# Patient Record
Sex: Female | Born: 1960
Health system: Southern US, Community
[De-identification: ages and names within clinical notes are randomized; demographics above are authoritative.]

## PROBLEM LIST (undated history)

## (undated) DIAGNOSIS — T8859XA Other complications of anesthesia, initial encounter: Secondary | ICD-10-CM

## (undated) HISTORY — DX: Other complications of anesthesia, initial encounter: T88.59XA

## (undated) HISTORY — PX: LAPAROSCOPIC OVARIAN CYSTECTOMY: SUR786

## (undated) HISTORY — PX: DILATION AND CURETTAGE OF UTERUS: SHX78

---

## 2002-04-22 ENCOUNTER — Other Ambulatory Visit: Admission: RE | Admit: 2002-04-22 | Discharge: 2002-04-22 | Payer: Self-pay | Admitting: Obstetrics and Gynecology

## 2003-01-29 ENCOUNTER — Ambulatory Visit (HOSPITAL_COMMUNITY): Admission: RE | Admit: 2003-01-29 | Discharge: 2003-01-29 | Payer: Self-pay | Admitting: Obstetrics and Gynecology

## 2003-04-29 ENCOUNTER — Other Ambulatory Visit: Admission: RE | Admit: 2003-04-29 | Discharge: 2003-04-29 | Payer: Self-pay | Admitting: Obstetrics and Gynecology

## 2004-08-05 ENCOUNTER — Other Ambulatory Visit: Admission: RE | Admit: 2004-08-05 | Discharge: 2004-08-05 | Payer: Self-pay | Admitting: Obstetrics and Gynecology

## 2005-09-02 ENCOUNTER — Other Ambulatory Visit: Admission: RE | Admit: 2005-09-02 | Discharge: 2005-09-02 | Payer: Self-pay | Admitting: Obstetrics and Gynecology

## 2015-02-19 ENCOUNTER — Other Ambulatory Visit: Payer: Self-pay | Admitting: Obstetrics and Gynecology

## 2015-02-20 LAB — CYTOLOGY - PAP

## 2016-02-17 ENCOUNTER — Encounter: Payer: Self-pay | Admitting: Internal Medicine

## 2016-04-19 ENCOUNTER — Encounter: Payer: Self-pay | Admitting: Internal Medicine

## 2016-05-17 ENCOUNTER — Encounter (INDEPENDENT_AMBULATORY_CARE_PROVIDER_SITE_OTHER): Payer: Self-pay

## 2016-05-17 ENCOUNTER — Ambulatory Visit (AMBULATORY_SURGERY_CENTER): Payer: Self-pay

## 2016-05-17 VITALS — Ht 65.5 in | Wt 133.0 lb

## 2016-05-17 DIAGNOSIS — Z800 Family history of malignant neoplasm of digestive organs: Secondary | ICD-10-CM

## 2016-05-17 NOTE — Progress Notes (Signed)
Per pt, no allergies to soy or egg products.Pt not taking any weight loss meds or using  O2 at home. 

## 2016-05-18 ENCOUNTER — Encounter: Payer: Self-pay | Admitting: Internal Medicine

## 2016-05-27 ENCOUNTER — Encounter: Payer: Self-pay | Admitting: Internal Medicine

## 2016-05-27 ENCOUNTER — Ambulatory Visit (AMBULATORY_SURGERY_CENTER): Payer: 59 | Admitting: Internal Medicine

## 2016-05-27 VITALS — BP 107/66 | HR 72 | Temp 98.7°F | Resp 17 | Ht 65.5 in | Wt 133.0 lb

## 2016-05-27 DIAGNOSIS — Z1211 Encounter for screening for malignant neoplasm of colon: Secondary | ICD-10-CM | POA: Diagnosis present

## 2016-05-27 MED ORDER — SODIUM CHLORIDE 0.9 % IV SOLN: 500.0000 mL | INTRAVENOUS | Status: AC

## 2016-05-27 NOTE — Op Note (Signed)
[facility] Patient Name: Shelby Moody Procedure Date: 05/27/2016 8:08 AM MRN: [id number] Endoscopist: Gatha Mayer , MD Age: 55 Referring MD:  Date of Birth: 1961-04-25 Gender: Female Account #: [id number] Procedure:                Colonoscopy Indications:              Screening for colorectal malignant neoplasm Medicines:                Propofol per Anesthesia, Monitored Anesthesia Care Procedure:                Pre-Anesthesia Assessment:                           - Prior to the procedure, a History and Physical                            was performed, and patient medications and                            allergies were reviewed. The patient's tolerance of                            previous anesthesia was also reviewed. The risks                            and benefits of the procedure and the sedation                            options and risks were discussed with the patient.                            All questions were answered, and informed consent                            was obtained. Prior Anticoagulants: The patient has                            taken no previous anticoagulant or antiplatelet                            agents. ASA Grade Assessment: I - A normal, healthy                            patient. After reviewing the risks and benefits,                            the patient was deemed in satisfactory condition to                            undergo the procedure.                           After obtaining informed consent, the colonoscope  was passed under direct vision. Throughout the                            procedure, the patient's blood pressure, pulse, and                            oxygen saturations were monitored continuously. The                            Model CF-HQ190L [id number]) scope was introduced                            through the anus and advanced to the the cecum,   identified by appendiceal orifice and ileocecal                            valve. The ileocecal valve, appendiceal orifice,                            and rectum were photographed. The quality of the                            bowel preparation was excellent. The bowel                            preparation used was Miralax. Scope In: 8:14:15 AM Scope Out: 8:27:30 AM Scope Withdrawal Time: 0 hours 10 minutes 5 seconds  Total Procedure Duration: 0 hours 13 minutes 15 seconds  Findings:                 The perianal and digital rectal examinations were                            normal.                           A few diverticula were found in the sigmoid colon.                           The exam was otherwise without abnormality on                            direct and retroflexion views. Complications:            No immediate complications. Estimated blood loss:                            None. Estimated Blood Loss:     Estimated blood loss: none. Recommendation:           - Repeat colonoscopy in 10 years for screening                            purposes.                           - Resume previous diet.                           -  Continue present medications.                           - Patient has a contact number available for                            emergencies. The signs and symptoms of potential                            delayed complications were discussed with the                            patient. Return to normal activities tomorrow.                            Written discharge instructions were provided to the                            patient. Gatha Mayer, MD 05/27/2016 8:36:08 AM This report has been signed electronically.

## 2016-05-27 NOTE — Progress Notes (Signed)
A/ox3 pleased with MAC, report to Suzanne RN 

## 2016-05-27 NOTE — Patient Instructions (Addendum)
No polyps or cancer seen.  You do have diverticulosis - thickened muscle rings and pouches in the colon wall. Please read the handout about this condition.  I appreciate the opportunity to care for you. Gatha Mayer, MD, FACG  YOU HAD AN ENDOSCOPIC PROCEDURE TODAY AT [facility]:   Refer to the procedure report that was given to you for any specific questions about what was found during the examination.  If the procedure report does not answer your questions, please call your gastroenterologist to clarify.  If you requested that your care partner not be given the details of your procedure findings, then the procedure report has been included in a sealed envelope for you to review at your convenience later.  YOU SHOULD EXPECT: Some feelings of bloating in the abdomen. Passage of more gas than usual.  Walking can help get rid of the air that was put into your GI tract during the procedure and reduce the bloating. If you had a lower endoscopy (such as a colonoscopy or flexible sigmoidoscopy) you may notice spotting of blood in your stool or on the toilet paper. If you underwent a bowel prep for your procedure, you may not have a normal bowel movement for a few days.  Please Note:  You might notice some irritation and congestion in your nose or some drainage.  This is from the oxygen used during your procedure.  There is no need for concern and it should clear up in a day or so.  SYMPTOMS TO REPORT IMMEDIATELY:   Following lower endoscopy (colonoscopy or flexible sigmoidoscopy):  Excessive amounts of blood in the stool  Significant tenderness or worsening of abdominal pains  Swelling of the abdomen that is new, acute  Fever of 100F or higher   For urgent or emergent issues, a gastroenterologist can be reached at any hour by calling [phone].   DIET:  We do recommend a small meal at first, but then you may proceed to your regular diet.  Drink plenty of  fluids but you should avoid alcoholic beverages for 24 hours. Try to increase the fiber in your diet, and drink plenty of water.  ACTIVITY:  You should plan to take it easy for the rest of today and you should NOT DRIVE or use heavy machinery until tomorrow (because of the sedation medicines used during the test).    FOLLOW UP: Our staff will call the number listed on your records the next business day following your procedure to check on you and address any questions or concerns that you may have regarding the information given to you following your procedure. If we do not reach you, we will leave a message.  However, if you are feeling well and you are not experiencing any problems, there is no need to return our call.  We will assume that you have returned to your regular daily activities without incident.  If any biopsies were taken you will be contacted by phone or by letter within the next 1-3 weeks.  Please call us at [phone] if you have not heard about the biopsies in 3 weeks.    SIGNATURES/CONFIDENTIALITY: You and/or your care partner have signed paperwork which will be entered into your electronic medical record.  These signatures attest to the fact that that the information above on your After Visit Summary has been reviewed and is understood.  Full responsibility of the confidentiality of this discharge information lies with you and/or your  care-partner.  Read all of the  Handouts given to you by your recovery room nurse.   Thank-you for choosing [facility] for your healthcare needs today.

## 2016-05-30 ENCOUNTER — Telehealth: Payer: Self-pay

## 2016-05-30 NOTE — Telephone Encounter (Signed)
  Follow up Call-  Call back number 05/27/2016  Post procedure Call Back phone  # [phone]  Permission to leave phone message Yes  Some recent data might be hidden    Patient was called for follow up after her procedure on 05/27/2016. No answer at the number given for follow up phone call. This was a second attempt to reach the patient. A message was left on the answering machine.

## 2016-05-30 NOTE — Telephone Encounter (Signed)
Left a message at [phone] for the pt to call us back if any questions or concerns. maw

## 2016-05-30 NOTE — Telephone Encounter (Signed)
  Follow up Call-  Call back number 05/27/2016  Post procedure Call Back phone  # [phone]  Permission to leave phone message Yes  Some recent data might be hidden     Patient questions:  Do you have a fever, pain , or abdominal swelling? No. Pain Score  0 *  Have you tolerated food without any problems? Yes.    Have you been able to return to your normal activities? Yes.    Do you have any questions about your discharge instructions: Diet   No. Medications  No. Follow up visit  No.  Do you have questions or concerns about your Care? No.  Actions: * If pain score is 4 or above: No action needed, pain <4.

## 2017-05-31 DIAGNOSIS — Z6821 Body mass index (BMI) 21.0-21.9, adult: Secondary | ICD-10-CM | POA: Diagnosis not present

## 2017-05-31 DIAGNOSIS — Z01419 Encounter for gynecological examination (general) (routine) without abnormal findings: Secondary | ICD-10-CM | POA: Diagnosis not present

## 2017-05-31 DIAGNOSIS — N760 Acute vaginitis: Secondary | ICD-10-CM | POA: Diagnosis not present

## 2017-05-31 DIAGNOSIS — R9389 Abnormal findings on diagnostic imaging of other specified body structures: Secondary | ICD-10-CM | POA: Diagnosis not present

## 2017-05-31 DIAGNOSIS — Z1231 Encounter for screening mammogram for malignant neoplasm of breast: Secondary | ICD-10-CM | POA: Diagnosis not present

## 2017-07-03 DIAGNOSIS — L821 Other seborrheic keratosis: Secondary | ICD-10-CM | POA: Diagnosis not present

## 2017-07-03 DIAGNOSIS — D225 Melanocytic nevi of trunk: Secondary | ICD-10-CM | POA: Diagnosis not present

## 2017-07-03 DIAGNOSIS — L814 Other melanin hyperpigmentation: Secondary | ICD-10-CM | POA: Diagnosis not present

## 2017-07-03 DIAGNOSIS — D1801 Hemangioma of skin and subcutaneous tissue: Secondary | ICD-10-CM | POA: Diagnosis not present

## 2017-09-20 ENCOUNTER — Emergency Department (HOSPITAL_COMMUNITY)
Admission: EM | Admit: 2017-09-20 | Discharge: 2017-09-20 | Disposition: A | Discharge status: Home / Self Care | Payer: No Typology Code available for payment source | Attending: Emergency Medicine | Admitting: Emergency Medicine

## 2017-09-20 ENCOUNTER — Emergency Department (HOSPITAL_COMMUNITY): Payer: No Typology Code available for payment source

## 2017-09-20 ENCOUNTER — Encounter (HOSPITAL_COMMUNITY): Payer: Self-pay | Admitting: Emergency Medicine

## 2017-09-20 DIAGNOSIS — R42 Dizziness and giddiness: Secondary | ICD-10-CM | POA: Insufficient documentation

## 2017-09-20 DIAGNOSIS — R202 Paresthesia of skin: Secondary | ICD-10-CM | POA: Insufficient documentation

## 2017-09-20 DIAGNOSIS — R0789 Other chest pain: Secondary | ICD-10-CM | POA: Diagnosis not present

## 2017-09-20 DIAGNOSIS — Z79899 Other long term (current) drug therapy: Secondary | ICD-10-CM | POA: Insufficient documentation

## 2017-09-20 DIAGNOSIS — R079 Chest pain, unspecified: Secondary | ICD-10-CM

## 2017-09-20 LAB — CBC
HEMATOCRIT: 43.2 % (ref 36.0–46.0)
HEMOGLOBIN: 14.5 g/dL (ref 12.0–15.0)
MCH: 30 pg (ref 26.0–34.0)
MCHC: 33.6 g/dL (ref 30.0–36.0)
MCV: 89.3 fL (ref 78.0–100.0)
Platelets: 212 10*3/uL (ref 150–400)
RBC: 4.84 MIL/uL (ref 3.87–5.11)
RDW: 13.9 % (ref 11.5–15.5)
WBC: 8.5 10*3/uL (ref 4.0–10.5)

## 2017-09-20 LAB — BASIC METABOLIC PANEL
ANION GAP: 10 (ref 5–15)
BUN: 18 mg/dL (ref 6–20)
CALCIUM: 9.5 mg/dL (ref 8.9–10.3)
CO2: 26 mmol/L (ref 22–32)
Chloride: 101 mmol/L (ref 101–111)
Creatinine, Ser: 0.72 mg/dL (ref 0.44–1.00)
Glucose, Bld: 78 mg/dL (ref 65–99)
POTASSIUM: 4.1 mmol/L (ref 3.5–5.1)
SODIUM: 137 mmol/L (ref 135–145)

## 2017-09-20 LAB — I-STAT TROPONIN, ED
TROPONIN I, POC: 0 ng/mL (ref 0.00–0.08)
TROPONIN I, POC: 0 ng/mL (ref 0.00–0.08)

## 2017-09-20 LAB — I-STAT BETA HCG BLOOD, ED (MC, WL, AP ONLY)

## 2017-09-20 MED ORDER — PANTOPRAZOLE SODIUM 20 MG PO TBEC: 40.0000 mg | DELAYED_RELEASE_TABLET | Freq: Every day | ORAL | 0 refills | Status: AC

## 2017-09-20 MED FILL — PANTOPRAZOLE SOD DR 20 MG T: 20 | 7 days supply | Qty: 14 | Fill #0

## 2017-09-20 NOTE — ED Provider Notes (Signed)
[facility] DEPT Provider Note   CSN: [id number] Arrival date & time: 09/20/17  1004     History   Chief Complaint Chief Complaint  Patient presents with  . Chest Pain  . Dizziness    HPI Shelby Moody is a 57 y.o. female.  HPI  Over the last few days has had increasing symptoms of chest pain, feels like indigestion, lump in the throat feeling.  Pain today felt some pressure, also felt it yesterday. Stayed in anterior chest, no radiation. Has had lightheadedness with episodes.  Tingling in both fingers today while working in [place] with other symptoms, blood perssure was 140/104. No shortness of breath, nausea, diaphoresis. Pain 2-3/10, 6AM had some burning.   Had stressful event in November, had some regurg develop  No leg pain or swelling, no cough or fever No hx of DVT, PE, no medical hx. Has PCP. No fam hx of early heart disease, no smoking or other drugs.    Past Medical History:  Diagnosis Date  . Slow to wake up after anesthesia    after cystectomy     There are no active problems to display for this patient.   Past Surgical History:  Procedure Laterality Date  . DILATION AND CURETTAGE OF UTERUS    . LAPAROSCOPIC OVARIAN CYSTECTOMY      OB History    No data available       Home Medications    Prior to Admission medications   Medication Sig Start Date End Date Taking? Authorizing Provider  Calcium Carbonate-Vitamin D (CALCIUM 600+D) 600-400 MG-UNIT tablet Take 1 tablet by mouth daily.   Yes [provider]  Cholecalciferol (VITAMIN D3) 2000 units TABS Take by mouth 2 (two) times daily.   Yes [provider]  ibuprofen (ADVIL,MOTRIN) 200 MG tablet Take 400 mg by mouth every 6 (six) hours as needed for mild pain or moderate pain.   Yes [provider]  Multiple Vitamin (MULTIVITAMIN) tablet Take 1 tablet by mouth daily.   Yes [provider]  Omega-3 Fatty Acids (FISH OIL) 1000 MG CAPS Take  by mouth daily.   Yes [provider]  Ginkgo Biloba 120 MG CAPS Take by mouth daily.    [provider]  pantoprazole (PROTONIX) 20 MG tablet Take 2 tablets (40 mg total) by mouth daily for 7 days. 09/20/17 09/27/17  Gareth Morgan, MD    Family History Family History  Problem Relation Age of Onset  . Hypertension Mother   . Thyroid cancer Brother   . Colon cancer Paternal Aunt   . Thyroid cancer Brother     Social History Social History   Tobacco Use  . Smoking status: Never Smoker  . Smokeless tobacco: Never Used  Substance Use Topics  . Alcohol use: Yes    Alcohol/week: 1.8 oz    Types: 3 Glasses of wine per week  . Drug use: No     Allergies   Patient has no known allergies.   Review of Systems Review of Systems  Constitutional: Negative for fever.  HENT: Negative for sore throat.   Eyes: Negative for visual disturbance.  Respiratory: Negative for cough and shortness of breath.   Cardiovascular: Positive for chest pain. Negative for leg swelling.  Gastrointestinal: Negative for abdominal pain, nausea and vomiting.  Genitourinary: Negative for difficulty urinating.  Musculoskeletal: Negative for back pain and neck pain.  Skin: Negative for rash.  Neurological: Positive for light-headedness. Negative for syncope and headaches.  Physical Exam Updated Vital Signs BP 116/73   Pulse 63   Temp 97.7 F (36.5 C) (Oral)   Resp 18   Ht 5' 5.5" (1.664 m)   Wt 56.7 kg (125 lb)   LMP 07/01/2017   SpO2 99%   BMI 20.48 kg/m   Physical Exam  Constitutional: She is oriented to person, place, and time. She appears well-developed and well-nourished. No distress.  HENT:  Head: Normocephalic and atraumatic.  Eyes: Conjunctivae and EOM are normal.  Neck: Normal range of motion.  Cardiovascular: Normal rate, regular rhythm, normal heart sounds and intact distal pulses. Exam reveals no gallop and no friction rub.  No murmur heard. Pulmonary/Chest:  Effort normal and breath sounds normal. No respiratory distress. She has no wheezes. She has no rales.  Abdominal: Soft. She exhibits no distension. There is no tenderness. There is no guarding.  Musculoskeletal: She exhibits no edema or tenderness.  Neurological: She is alert and oriented to person, place, and time.  Skin: Skin is warm and dry. No rash noted. She is not diaphoretic. No erythema.  Nursing note and vitals reviewed.    ED Treatments / Results  Labs (all labs ordered are listed, but only abnormal results are displayed) Labs Reviewed  BASIC METABOLIC PANEL  CBC  I-STAT TROPONIN, ED  I-STAT BETA HCG BLOOD, ED ([facility], [facility], [facility] ONLY)  I-STAT TROPONIN, ED    EKG  EKG Interpretation  Date/Time:  Wednesday September 20 2017 10:18:45 EST Ventricular Rate:  70 PR Interval:    QRS Duration: 98 QT Interval:  387 QTC Calculation: 418 R Axis:   93 Text Interpretation:  Sinus rhythm Borderline right axis deviation No previous ECGs available Confirmed by Gareth Morgan [phone]) on 09/20/2017 11:25:24 AM       Radiology Dg Chest 2 View  Result Date: 09/20/2017 CLINICAL DATA:  Mid chest pain, pressure, dizziness EXAM: CHEST  2 VIEW COMPARISON:  None. FINDINGS: Heart and mediastinal contours are within normal limits. No focal opacities or effusions. No acute bony abnormality. IMPRESSION: No active cardiopulmonary disease. Electronically Signed   By: Rolm Baptise M.D.   On: 09/20/2017 10:50    Procedures Procedures (including critical care time)  Medications Ordered in ED Medications - No data to display   Initial Impression / Assessment and Plan / ED Course  I have reviewed the triage vital signs and the nursing notes.  Pertinent labs & imaging results that were available during my care of the patient were reviewed by me and considered in my medical decision making (see chart for details).     57 year old female with no significant medical history presents with concern for  chest pain.  EKG shows no significant findings.  Chest x-ray shows no sign of pneumothorax or pneumonia.  Have low suspicion for pulmonary embolus given no shortness of breath, no estrogen use, no asymmetric leg pain or swelling, no recent immobilization, no tachypnea, hypoxia or tachycardia.  CBC and BMP within normal limits.  Patient is a low risk heart score, and has 2 negative troponins.  Recommend close PCP follow up. Given rx for protonix given sensation of indigestion.  Patient discharged in stable condition with understanding of reasons to return.   Final Clinical Impressions(s) / ED Diagnoses   Final diagnoses:  Chest pain, unspecified type    ED Discharge Orders        Ordered    pantoprazole (PROTONIX) 20 MG tablet  Daily     09/20/17 1448  Gareth Morgan, MD 09/20/17 1501

## 2017-09-20 NOTE — ED Notes (Signed)
Blood draw x1 unsuccessful. 

## 2017-09-20 NOTE — ED Triage Notes (Signed)
Patient c/o intermittent chest pains on left side and central chest pains. Reports that is burning and was relieved last night when she took Mylanta and drank buttermilk. Patient did have some neck pain but unsure if because her anxiety.

## 2017-09-20 NOTE — ED Notes (Signed)
Attempted blood draw; unsuccessful. Patient has been stuck 4 times today so did not want to try again. RN tech who was successful in triage to try.

## 2017-09-26 MED FILL — PANTOPRAZOLE SOD DR 40 MG T: 40 | 90 days supply | Qty: 90 | Fill #0

## 2018-02-28 MED FILL — PANTOPRAZOLE SOD DR 40 MG T: 40 | 90 days supply | Qty: 90 | Fill #1

## 2018-06-07 MED FILL — PANTOPRAZOLE SOD DR 40 MG T: 40 | 90 days supply | Qty: 90 | Fill #0

## 2018-07-10 ENCOUNTER — Ambulatory Visit (INDEPENDENT_AMBULATORY_CARE_PROVIDER_SITE_OTHER): Payer: Self-pay | Admitting: Nurse Practitioner

## 2018-07-10 VITALS — BP 115/80 | HR 78 | Temp 98.2°F | Resp 16 | Wt 127.4 lb

## 2018-07-10 DIAGNOSIS — J029 Acute pharyngitis, unspecified: Secondary | ICD-10-CM

## 2018-07-10 LAB — POCT RAPID STREP A (OFFICE): Rapid Strep A Screen: NEGATIVE

## 2018-07-10 MED ORDER — MAGIC MOUTHWASH W/LIDOCAINE: 5.0000 mL | Freq: Three times a day (TID) | ORAL | 0 refills | Status: AC | PRN

## 2018-07-10 NOTE — Progress Notes (Signed)
Subjective:     Shelby Moody is a 57 y.o. female who presents for evaluation of sore throat. Associated symptoms include sore throat. Onset of symptoms was 1 day ago, and have been unchanged since that time. Patient rates current pain 6/10 at present. She is drinking plenty of fluids. She has not had a recent close exposure to someone with proven streptococcal pharyngitis.  The patient states she has taken Advil for her symptoms, which has helped her pain.    The following portions of the patient's history were reviewed and updated as appropriate: allergies, current medications and past medical history.  Review of Systems Constitutional: positive for chills, negative for anorexia, fatigue, fevers and malaise Eyes: negative Ears, nose, mouth, throat, and face: positive for sore throat, negative for ear drainage, earaches, hoarseness and nasal congestion Respiratory: negative Cardiovascular: negative Gastrointestinal: negative Neurological: negative    Objective:    BP 115/80 (BP Location: Right Arm, Patient Position: Sitting, Cuff Size: Normal)   Pulse 78   Temp 98.2 F (36.8 C) (Oral)   Resp 16   Wt 127 lb 6.4 oz (57.8 kg)   SpO2 98%   BMI 20.88 kg/m  General appearance: alert, cooperative and no distress Head: Normocephalic, without obvious abnormality, atraumatic Eyes: conjunctivae/corneas clear. PERRL, EOM's intact. Fundi benign. Ears: normal TM's and external ear canals both ears Nose: Nares normal. Septum midline. Mucosa normal. No drainage or sinus tenderness. Throat: abnormal findings: mild oropharyngeal erythema and no exudates, tonsils +1 bilaterally, uvula midline Lungs: clear to auscultation bilaterally Heart: regular rate and rhythm, S1, S2 normal, no murmur, click, rub or gallop Abdomen: soft, non-tender; bowel sounds normal; no masses,  no organomegaly Pulses: 2+ and symmetric Skin: Skin color, texture, turgor normal. No rashes or lesions Lymph nodes: cervical and  submandibular nodes normal Neurologic: Grossly normal  Laboratory Strep test done. Results:negative.    Assessment:    Acute pharyngitis, likely  Viral pharyngitis.    Plan:   Exam findings, diagnosis etiology and medication use and indications reviewed with patient. Follow- Up and discharge instructions provided. No emergent/urgent issues found on exam. Patient education was provided. Patient verbalized understanding of information provided and agrees with plan of care (POC), all questions answered. The patient is advised to call or return to clinic if condition does not see an improvement in symptoms, or to seek the care of the closest emergency department if condition worsens with the above plan.   1. Sore throat  - POCT rapid strep A-negative  2. Viral pharyngitis  - magic mouthwash w/lidocaine SOLN; Take 5 mLs by mouth 3 (three) times daily as needed for up to 7 days for mouth pain. Gargle and swallow 76mL every 8 hours as needed for throat pain.  Dispense: 105 mL; Refill: 0 -Magic mouthwash as prescribed. -Ibuprofen 800mg  every 8 hours for the next 3 days.  May stop sooner if symptoms improve. -Increase fluids. -Warm salt water gargles 3-4 times daily until symptoms improve. -May use a teaspoon of honey as needed for throat discomfort. -Warm liquids to help soothe the throat during discomfort. -Follow-up in the next 5 to 7 days if symptoms do not improve.

## 2018-07-10 NOTE — Patient Instructions (Addendum)
Pharyngitis -Magic mouthwash as prescribed. -Ibuprofen 800mg  every 8 hours for the next 3 days.  May stop sooner if symptoms improve. -Increase fluids. -Warm salt water gargles 3-4 times daily until symptoms improve. -May use a teaspoon of honey as needed for throat discomfort. -Warm liquids to help soothe the throat during discomfort. -Follow-up in the next 5 to 7 days if symptoms do not improve.  Pharyngitis is redness, pain, and swelling (inflammation) of the throat (pharynx). It is a very common cause of sore throat. Pharyngitis can be caused by a bacteria, but it is usually caused by a virus. Most cases of pharyngitis get better on their own without treatment. What are the causes? This condition may be caused by:  Infection by viruses (viral). Viral pharyngitis spreads from person to person (is contagious) through coughing, sneezing, and sharing of personal items or utensils such as cups, forks, spoons, and toothbrushes.  Infection by bacteria (bacterial). Bacterial pharyngitis may be spread by touching the nose or face after coming in contact with the bacteria, or through more intimate contact, such as kissing.  Allergies. Allergies can cause buildup of mucus in the throat (post-nasal drip), leading to inflammation and irritation. Allergies can also cause blocked nasal passages, forcing breathing through the mouth, which dries and irritates the throat.  What increases the risk? You are more likely to develop this condition if:  You are 68-57 years old.  You are exposed to crowded environments such as daycare, school, or dormitory living.  You live in a cold climate.  You have a weakened disease-fighting (immune) system.  What are the signs or symptoms? Symptoms of this condition vary by the cause (viral, bacterial, or allergies) and can include:  Sore throat.  Fatigue.  Low-grade fever.  Headache.  Joint pain and muscle aches.  Skin rashes.  Swollen glands in the  throat (lymph nodes).  Plaque-like film on the throat or tonsils. This is often a symptom of bacterial pharyngitis.  Vomiting.  Stuffy nose (nasal congestion).  Cough.  Red, itchy eyes (conjunctivitis).  Loss of appetite.  How is this diagnosed? This condition is often diagnosed based on your medical history and a physical exam. Your health care provider will ask you questions about your illness and your symptoms. A swab of your throat may be done to check for bacteria (rapid strep test). Other lab tests may also be done, depending on the suspected cause, but these are rare. How is this treated? This condition usually gets better in 3-4 days without medicine. Bacterial pharyngitis may be treated with antibiotic medicines. Follow these instructions at home:  Take over-the-counter and prescription medicines only as told by your health care provider. ? If you were prescribed an antibiotic medicine, take it as told by your health care provider. Do not stop taking the antibiotic even if you start to feel better. ? Do not give children aspirin because of the association with Reye syndrome.  Drink enough water and fluids to keep your urine clear or pale yellow.  Get a lot of rest.  Gargle with a salt-water mixture 3-4 times a day or as needed. To make a salt-water mixture, completely dissolve -1 tsp of salt in 1 cup of warm water.  If your health care provider approves, you may use throat lozenges or sprays to soothe your throat. Contact a health care provider if:  You have large, tender lumps in your neck.  You have a rash.  You cough up green, yellow-brown, or bloody spit.  Get help right away if:  Your neck becomes stiff.  You drool or are unable to swallow liquids.  You cannot drink or take medicines without vomiting.  You have severe pain that does not go away, even after you take medicine.  You have trouble breathing, and it is not caused by a stuffy nose.  You have  new pain and swelling in your joints such as the knees, ankles, wrists, or elbows. Summary  Pharyngitis is redness, pain, and swelling (inflammation) of the throat (pharynx).  While pharyngitis can be caused by a bacteria, the most common causes are viral.  Most cases of pharyngitis get better on their own without treatment.  Bacterial pharyngitis is treated with antibiotic medicines. This information is not intended to replace advice given to you by your health care provider. Make sure you discuss any questions you have with your health care provider. Document Released: 08/15/2005 Document Revised: 09/20/2016 Document Reviewed: 09/20/2016 Elsevier Interactive Patient Education  [employer].

## 2018-10-25 MED FILL — MECLIZINE 25 MG TABLET: 25 | 30 days supply | Qty: 30 | Fill #0

## 2019-06-17 MED FILL — PANTOPRAZOLE SOD DR 40 MG T: 40 | 90 days supply | Qty: 30 | Fill #0

## 2019-09-24 IMAGING — CR DG CHEST 2V
2 series · 2 of 2 positions shown · non-contrast
Comparison: None.

CLINICAL DATA: Mid chest pain, pressure, dizziness

EXAM:
CHEST  2 VIEW

[w chest pa]
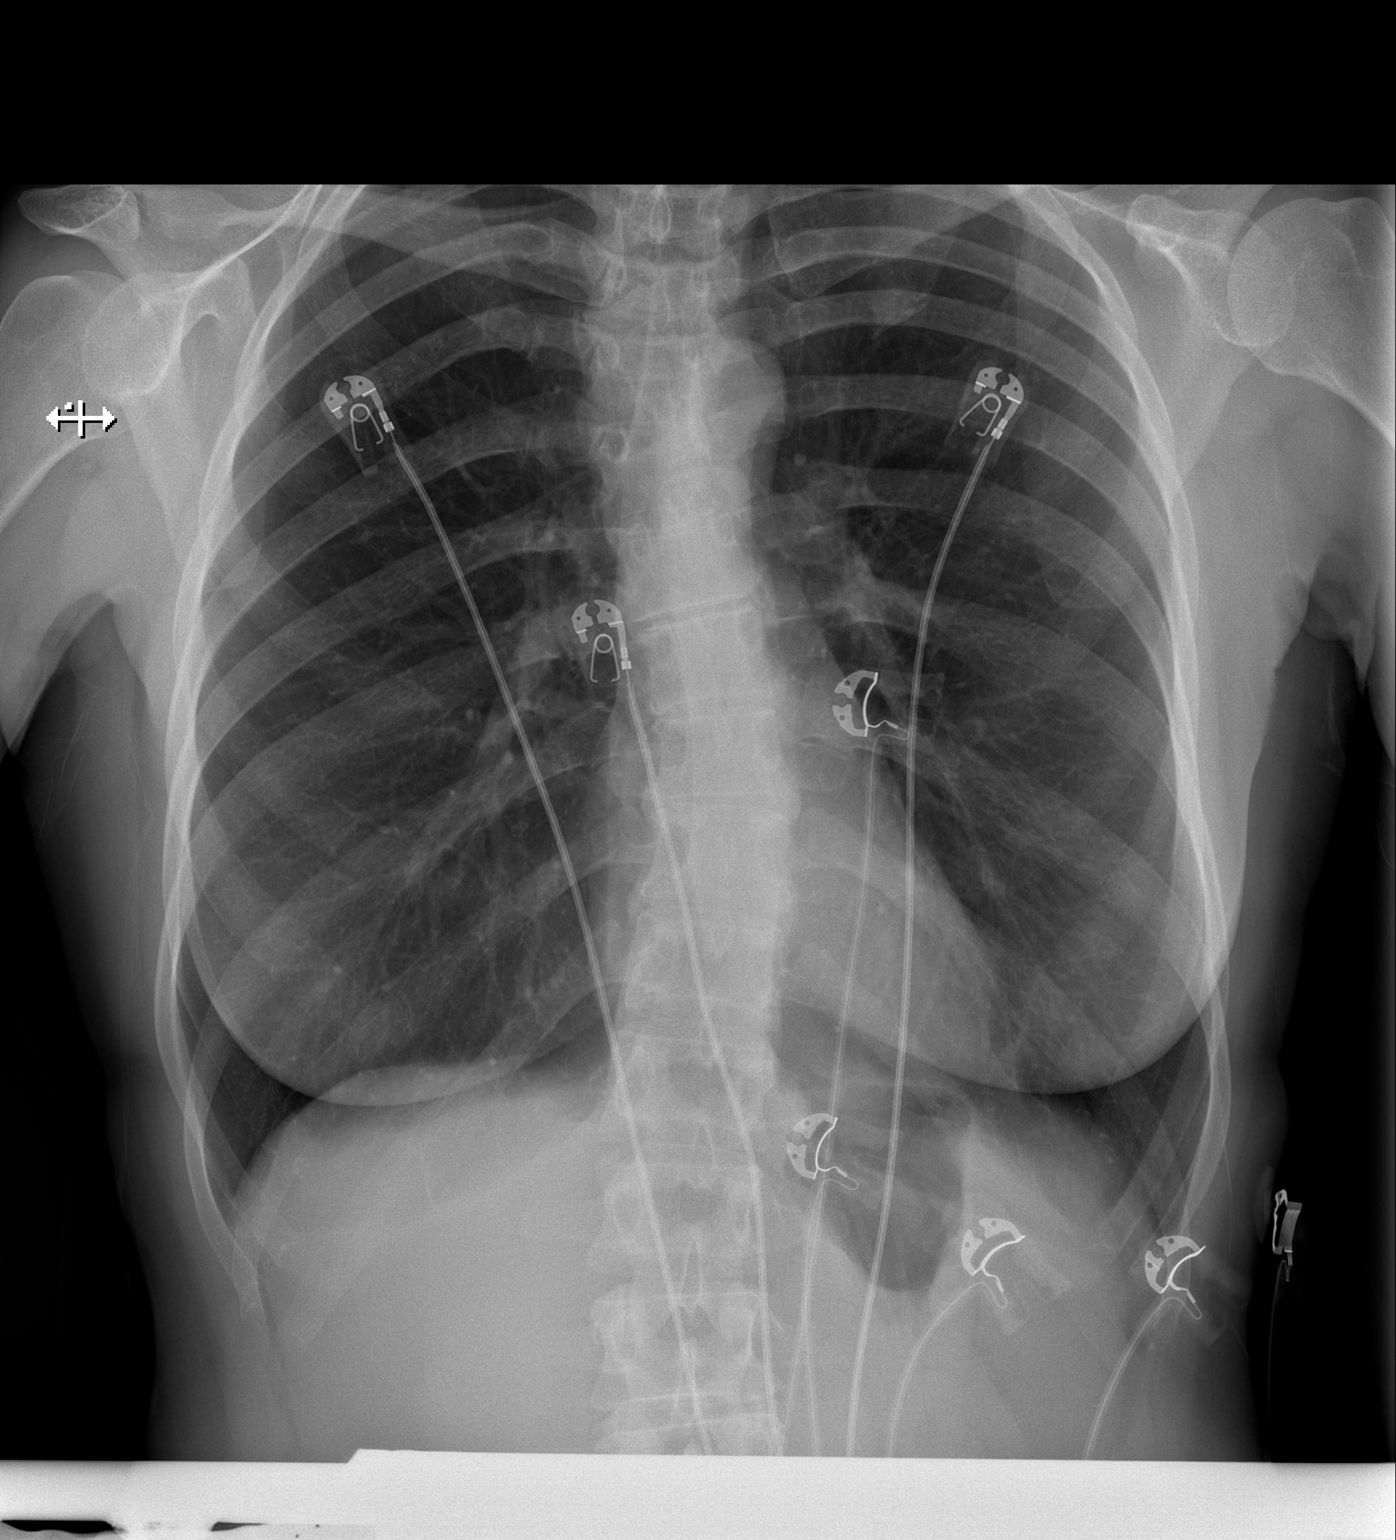

[w chest lat]
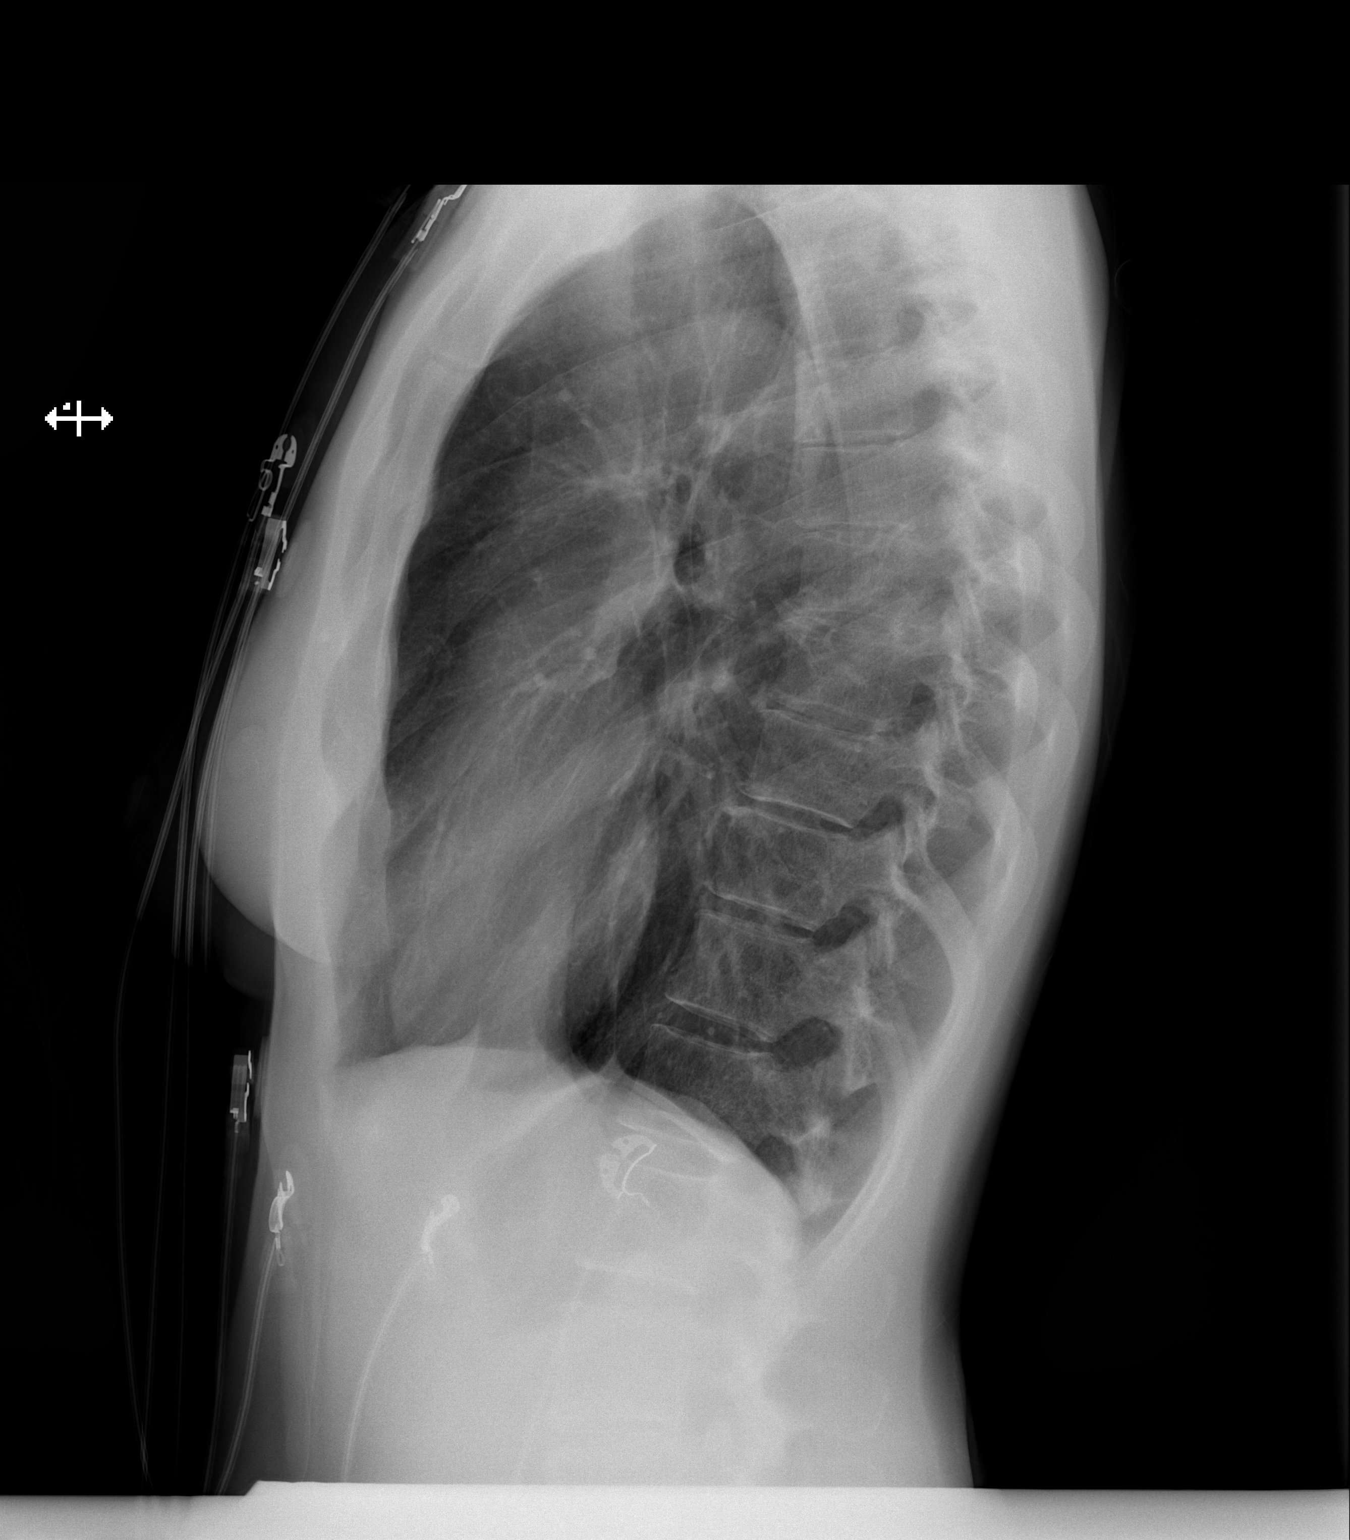

[2 of 2 positions shown; findings below may reference images not displayed]

FINDINGS: Heart and mediastinal contours are within normal limits. No focal
opacities or effusions. No acute bony abnormality.
IMPRESSION: No active cardiopulmonary disease.

## 2019-10-28 MED FILL — PREMARIN VAGINAL CREAM-APPL: 0.625 | 90 days supply | Qty: 30 | Fill #0

## 2019-11-04 MED FILL — MUPIROCIN 2% OINTMENT: 2 | 14 days supply | Qty: 22 | Fill #0

## 2019-11-06 MED FILL — PREMARIN VAGINAL CREAM-APPL: 0.625 | 90 days supply | Qty: 30 | Fill #0

## 2019-12-26 MED FILL — METRONIDAZOLE 500 MG TABS: 500 | 1 days supply | Qty: 4 | Fill #0

## 2019-12-26 MED FILL — PROGESTERONE 100 MG CAPS: 100 | 30 days supply | Qty: 30 | Fill #0

## 2019-12-30 MED FILL — CEPHALEXIN 250 MG CAPS: 250 | 7 days supply | Qty: 42 | Fill #0

## 2020-06-16 MED FILL — PANTOPRAZOLE SOD DR 40 MG T: 40 | 90 days supply | Qty: 30 | Fill #1

## 2020-08-12 ENCOUNTER — Other Ambulatory Visit (HOSPITAL_COMMUNITY): Payer: Self-pay | Admitting: Radiology

## 2020-08-12 MED FILL — DOXYCYCLINE HYCLATE 100 MG: 100 | 10 days supply | Qty: 20 | Fill #0

## 2021-01-27 ENCOUNTER — Other Ambulatory Visit (HOSPITAL_COMMUNITY): Payer: Self-pay

## 2021-01-27 MED ORDER — CARESTART COVID-19 HOME TEST VI KIT
PACK | 0 refills | Status: DC
  Filled 2021-01-27: qty 4, 4d supply, fill #0

## 2021-01-28 ENCOUNTER — Other Ambulatory Visit (HOSPITAL_COMMUNITY): Payer: Self-pay

## 2021-05-19 ENCOUNTER — Other Ambulatory Visit (HOSPITAL_COMMUNITY): Payer: Self-pay

## 2021-05-19 MED ORDER — TOBRAMYCIN-DEXAMETHASONE 0.3-0.1 % OP SUSP
OPHTHALMIC | 0 refills | Status: AC
  Filled 2021-05-19: qty 5, 14d supply, fill #0

## 2021-08-16 ENCOUNTER — Other Ambulatory Visit (HOSPITAL_COMMUNITY): Payer: Self-pay

## 2021-08-16 MED ORDER — MECLIZINE HCL 25 MG PO TABS
ORAL_TABLET | ORAL | 1 refills | Status: AC
  Filled 2021-08-16: qty 30, 30d supply, fill #0

## 2021-10-14 ENCOUNTER — Other Ambulatory Visit (HOSPITAL_COMMUNITY): Payer: Self-pay

## 2021-10-14 MED ORDER — CARESTART COVID-19 HOME TEST VI KIT
PACK | 0 refills | Status: DC
  Filled 2021-10-14: qty 4, 4d supply, fill #0

## 2022-01-06 ENCOUNTER — Other Ambulatory Visit (HOSPITAL_COMMUNITY): Payer: Self-pay

## 2022-01-06 MED ORDER — FLUCONAZOLE 150 MG PO TABS
ORAL_TABLET | ORAL | 0 refills | Status: DC
  Filled 2022-01-06: qty 2, 3d supply, fill #0

## 2022-01-06 MED ORDER — SULFAMETHOXAZOLE-TRIMETHOPRIM 800-160 MG PO TABS
ORAL_TABLET | ORAL | 0 refills | Status: DC
  Filled 2022-01-06: qty 14, 7d supply, fill #0

## 2022-01-07 ENCOUNTER — Other Ambulatory Visit (HOSPITAL_COMMUNITY): Payer: Self-pay

## 2022-01-07 MED ORDER — HYDROCORTISONE 2.5 % EX CREA
TOPICAL_CREAM | CUTANEOUS | 1 refills | Status: AC
  Filled 2022-01-07: qty 30, 15d supply, fill #0

## 2022-01-13 ENCOUNTER — Other Ambulatory Visit (HOSPITAL_COMMUNITY): Payer: Self-pay

## 2022-01-13 MED ORDER — SULFAMETHOXAZOLE-TRIMETHOPRIM 800-160 MG PO TABS
ORAL_TABLET | ORAL | 0 refills | Status: AC
  Filled 2022-01-13: qty 14, 7d supply, fill #0

## 2022-03-16 ENCOUNTER — Encounter (INDEPENDENT_AMBULATORY_CARE_PROVIDER_SITE_OTHER): Payer: No Typology Code available for payment source | Admitting: Ophthalmology

## 2022-03-16 DIAGNOSIS — H43813 Vitreous degeneration, bilateral: Secondary | ICD-10-CM | POA: Diagnosis not present

## 2022-03-16 DIAGNOSIS — H353132 Nonexudative age-related macular degeneration, bilateral, intermediate dry stage: Secondary | ICD-10-CM | POA: Diagnosis not present

## 2022-03-16 DIAGNOSIS — H4311 Vitreous hemorrhage, right eye: Secondary | ICD-10-CM

## 2022-04-21 ENCOUNTER — Encounter (INDEPENDENT_AMBULATORY_CARE_PROVIDER_SITE_OTHER): Payer: No Typology Code available for payment source | Admitting: Ophthalmology

## 2022-04-21 DIAGNOSIS — H43811 Vitreous degeneration, right eye: Secondary | ICD-10-CM | POA: Diagnosis not present

## 2022-04-21 DIAGNOSIS — H353132 Nonexudative age-related macular degeneration, bilateral, intermediate dry stage: Secondary | ICD-10-CM | POA: Diagnosis not present

## 2022-06-21 ENCOUNTER — Other Ambulatory Visit (HOSPITAL_COMMUNITY): Payer: Self-pay

## 2022-06-21 MED ORDER — NYSTATIN-TRIAMCINOLONE 100000-0.1 UNIT/GM-% EX CREA
1.0000 | TOPICAL_CREAM | CUTANEOUS | 0 refills | Status: AC
  Filled 2022-06-21: qty 15, 30d supply, fill #0

## 2022-09-07 ENCOUNTER — Other Ambulatory Visit (HOSPITAL_COMMUNITY): Payer: Self-pay

## 2022-09-07 DIAGNOSIS — M6701 Short Achilles tendon (acquired), right ankle: Secondary | ICD-10-CM | POA: Diagnosis not present

## 2022-09-07 DIAGNOSIS — M7661 Achilles tendinitis, right leg: Secondary | ICD-10-CM | POA: Diagnosis not present

## 2022-09-07 MED ORDER — NITROGLYCERIN 0.2 MG/HR TD PT24
MEDICATED_PATCH | TRANSDERMAL | 0 refills | Status: DC
  Filled 2022-09-07: qty 30, 60d supply, fill #0
  Filled 2022-11-07: qty 15, 30d supply, fill #1

## 2022-09-23 DIAGNOSIS — M7661 Achilles tendinitis, right leg: Secondary | ICD-10-CM | POA: Diagnosis not present

## 2022-11-07 ENCOUNTER — Other Ambulatory Visit (HOSPITAL_COMMUNITY): Payer: Self-pay

## 2022-11-29 ENCOUNTER — Other Ambulatory Visit (HOSPITAL_COMMUNITY): Payer: Self-pay

## 2022-11-29 ENCOUNTER — Encounter: Payer: Self-pay | Admitting: Obstetrics and Gynecology

## 2022-11-29 ENCOUNTER — Ambulatory Visit: Payer: 59 | Admitting: Obstetrics and Gynecology

## 2022-11-29 VITALS — BP 117/72 | HR 61 | Ht 64.6 in | Wt 132.2 lb

## 2022-11-29 DIAGNOSIS — N393 Stress incontinence (female) (male): Secondary | ICD-10-CM | POA: Diagnosis not present

## 2022-11-29 DIAGNOSIS — N362 Urethral caruncle: Secondary | ICD-10-CM | POA: Diagnosis not present

## 2022-11-29 DIAGNOSIS — N952 Postmenopausal atrophic vaginitis: Secondary | ICD-10-CM | POA: Diagnosis not present

## 2022-11-29 DIAGNOSIS — R350 Frequency of micturition: Secondary | ICD-10-CM

## 2022-11-29 LAB — POCT URINALYSIS DIPSTICK
Bilirubin, UA: NEGATIVE
Blood, UA: NEGATIVE
Glucose, UA: NEGATIVE
Ketones, UA: NEGATIVE
Leukocytes, UA: NEGATIVE
Nitrite, UA: NEGATIVE
Protein, UA: NEGATIVE
Spec Grav, UA: 1.025 (ref 1.010–1.025)
Urobilinogen, UA: 0.2 E.U./dL
pH, UA: 7 (ref 5.0–8.0)

## 2022-11-29 MED ORDER — ESTRADIOL 0.1 MG/GM VA CREA
0.5000 g | TOPICAL_CREAM | VAGINAL | 11 refills | Status: AC
  Filled 2022-11-29: qty 42.5, 90d supply, fill #0

## 2022-11-29 NOTE — Progress Notes (Signed)
[name]  Referring Provider: Carol Ada, MD PCP: Carol Ada, MD Date of Service: 11/29/2022  SUBJECTIVE Chief Complaint: New Patient (Initial Visit)  History of Present Illness: Shelby Moody is a 62 y.o. White or Caucasian female presenting for evaluation of prolapse and urethral prolapse.     Urinary Symptoms: Leaks urine with laughing and with a full bladder Leaks once every few months, not often Pad use:  liners/ mini-pads   She is bothered by her UI symptoms.  Has done pelvic floor PT for 6 months previously with Apolon[facility] Schneiders and [facility] Has never been on a medication for leakage  Day time voids 8.  Nocturia: 2 times per night to void. Voiding dysfunction: she does not empty her bladder well.  does not use a catheter to empty bladder.  When urinating, she feels a weak stream and the need to urinate multiple times in a row Drinks: Drinks 1 cup of black tea in the morning, 100 oz of Water per day  UTIs:  0  UTI's in the last year.   Denies history of blood in urine and kidney or bladder stones  Pelvic Organ Prolapse Symptoms:                  She Admits to a feeling of a bulge the vaginal area. It has been present for 2 years.  She Admits to seeing a bulge.  This bulge is bothersome.  Bowel Symptom: Bowel movements: 1 time(s) per day Stool consistency: soft  Straining: no.  Splinting: no.  Incomplete evacuation: no.  She Denies accidental bowel leakage / fecal incontinence Bowel regimen: fiber  Sexual Function Sexually active: yes.  Sexual orientation:  heterosexual Pain with sex: No  Pelvic Pain Denies pelvic pain    Past Medical History:  Past Medical History:  Diagnosis Date   Slow to wake up after anesthesia    after cystectomy      Past Surgical History:   Past Surgical History:  Procedure Laterality Date   DILATION AND CURETTAGE OF UTERUS     LAPAROSCOPIC OVARIAN CYSTECTOMY        Past OB/GYN History: G5 P3 Vaginal deliveries: 3,  Forceps/ Vacuum deliveries: Forceps used during first delivery, Cesarean section: 0 Menopausal: Yes, at age 62 Last pap smear was 2023.  Any history of abnormal pap smears: no.   Medications: She has a current medication list which includes the following prescription(s): calcium carbonate-vitamin d, vitamin d3, [START ON 12/01/2022] estradiol, hydrocortisone, meclizine, multivitamin, nitroglycerin, nystatin-triamcinolone, fish oil, ginkgo biloba, ibuprofen, pantoprazole, pantoprazole, sulfamethoxazole-trimethoprim, and tobramycin-dexamethasone, and the following Facility-Administered Medications: sodium chloride.   Allergies: Patient has No Known Allergies.   Social History:  Social History   Tobacco Use   Smoking status: Never   Smokeless tobacco: Never  Vaping Use   Vaping Use: Never used  Substance Use Topics   Alcohol use: Not Currently    Alcohol/week: 3.0 standard drinks of alcohol    Types: 3 Glasses of wine per week   Drug use: No    Relationship status: married She lives with husband.   She is employed at [employer]. Regular exercise: Yes: cardio and weights 5 days a week History of abuse: No  Family History:   Family History  Problem Relation Age of Onset   Hypertension Mother    Thyroid cancer Brother    Colon cancer Paternal Aunt    Thyroid cancer Brother  Review of Systems: Review of Systems  Constitutional:  Negative for fever, malaise/fatigue and weight loss.  Respiratory:  Negative for cough, shortness of breath and wheezing.   Cardiovascular:  Negative for chest pain, palpitations and leg swelling.  Gastrointestinal:  Negative for abdominal pain and blood in stool.  Genitourinary:  Negative for dysuria.  Musculoskeletal:  Negative for myalgias.  Skin:  Negative for rash.  Neurological:  Negative for dizziness and headaches.  Endo/Heme/Allergies:  Does not bruise/bleed easily.       +  hot flashes  Psychiatric/Behavioral:  Negative for depression. The patient is not nervous/anxious.      OBJECTIVE Physical Exam: Vitals:   11/29/22 1316  BP: 117/72  Pulse: 61  Weight: 132 lb 3.2 oz (60 kg)  Height: 5' 4.6" (1.641 m)    Physical Exam Constitutional:      General: She is not in acute distress. Pulmonary:     Effort: Pulmonary effort is normal.  Abdominal:     General: There is no distension.     Palpations: Abdomen is soft.     Tenderness: There is no abdominal tenderness. There is no rebound.  Musculoskeletal:        General: No swelling. Normal range of motion.  Skin:    General: Skin is warm and dry.     Findings: No rash.  Neurological:     Mental Status: She is alert and oriented to person, place, and time.  Psychiatric:        Mood and Affect: Mood normal.        Behavior: Behavior normal.      GU / Detailed Urogynecologic Evaluation:  Pelvic Exam: Normal external female genitalia; Bartholin's and Skene's glands normal in appearance; urethral meatus with small caruncle, no urethral masses or discharge.   CST: negative  Speculum exam reveals normal vaginal mucosa with atrophy. Cervix normal appearance. Uterus normal single, nontender. Adnexa no mass, fullness, tenderness.    Pelvic floor strength I/V  Pelvic floor musculature: Right levator non-tender, Right obturator non-tender, Left levator non-tender, Left obturator non-tender  POP-Q:   POP-Q  -2                                            Aa   -2                                           Ba  -6                                              C   3.5                                            Gh  3                                            Pb  7  tvl   -2.5                                            Ap  -2.5                                            Bp  -6.5                                              D      Rectal Exam:   Normal external rectum  Post-Void Residual (PVR) by Bladder Scan: In order to evaluate bladder emptying, we discussed obtaining a postvoid residual and she agreed to this procedure.  Procedure: The ultrasound unit was placed on the patient's abdomen in the suprapubic region after the patient had voided. A PVR of 0 ml was obtained by bladder scan.  Laboratory Results: POC urine: negative   ASSESSMENT AND PLAN Ms. Knippa is a 62 y.o. with:  1. Urethral caruncle   2. Vaginal atrophy   3. Urinary frequency   4. SUI (stress urinary incontinence, female)    - No significant prolapse noted on exam today. We discussed that if she is feeling it more or if it is becoming more bothersome then should return for repeat exam.   Urethral caruncle - discussed how this is a small urethral polyp and generally does not need treatment if not bothersome - can use vaginal estrogen  2. Vaginal atrophy - start vaginal estrogen nightly for two weeks then twice a week after.  - can use vaginal moisturizer or lubricant as needed  3. SUI - Does not feel symptoms are bothersome enough for treatment at this time - Recommended reincorporating pelvic floor exercises  Return as needed  Jaquita Folds, MD

## 2022-11-29 NOTE — Patient Instructions (Signed)
Start vaginal estrogen therapy nightly for two weeks then 2 times weekly at night for treatment of vaginal atrophy (dryness of the vaginal tissues).  Please let us know if the prescription is too expensive and we can look for alternative options.    Vulvovaginal moisturizer Options: Vitamin E oil (pump or capsule) or cream (Gene's Vit E Cream) Coconut oil Silicone-based lubricant for use during intercourse ("wet platinum" is a brand available at most drugstores) Good clean love lubricant Crisco Consider the ingredients of the product - the fewer the ingredients the better!  Directions for Use: Clean and dry your hands Gently dab the vulvar/vaginal area dry as needed Apply a "pea-sized" amount of the moisturizer onto your fingertip Using you other hand, open the labia  Apply the moisturizer to the vulvar/vaginal tissues Wear loose fitting underwear/clothing if possible following application Use moisturize up to 3 times daily as desired.

## 2022-12-27 ENCOUNTER — Other Ambulatory Visit (HOSPITAL_COMMUNITY): Payer: Self-pay

## 2022-12-27 DIAGNOSIS — Z6822 Body mass index (BMI) 22.0-22.9, adult: Secondary | ICD-10-CM | POA: Diagnosis not present

## 2022-12-27 DIAGNOSIS — Z1231 Encounter for screening mammogram for malignant neoplasm of breast: Secondary | ICD-10-CM | POA: Diagnosis not present

## 2022-12-27 DIAGNOSIS — Z01419 Encounter for gynecological examination (general) (routine) without abnormal findings: Secondary | ICD-10-CM | POA: Diagnosis not present

## 2022-12-27 DIAGNOSIS — Z13820 Encounter for screening for osteoporosis: Secondary | ICD-10-CM | POA: Diagnosis not present

## 2022-12-27 MED ORDER — ESTRADIOL 0.05 MG/24HR TD PTTW
1.0000 | MEDICATED_PATCH | TRANSDERMAL | 3 refills | Status: AC
  Filled 2022-12-27: qty 24, 84d supply, fill #0
  Filled 2023-03-12 – 2023-03-21 (×2): qty 24, 84d supply, fill #1
  Filled 2023-06-12: qty 24, 84d supply, fill #2
  Filled 2023-09-06: qty 24, 84d supply, fill #3

## 2022-12-27 MED ORDER — PROGESTERONE MICRONIZED 100 MG PO CAPS
100.0000 mg | ORAL_CAPSULE | Freq: Every day | ORAL | 3 refills | Status: DC
  Filled 2022-12-27: qty 90, 90d supply, fill #0
  Filled 2023-03-21: qty 90, 90d supply, fill #1
  Filled 2023-06-12: qty 90, 90d supply, fill #2
  Filled 2023-09-06: qty 90, 90d supply, fill #3

## 2022-12-28 ENCOUNTER — Other Ambulatory Visit (HOSPITAL_COMMUNITY): Payer: Self-pay

## 2022-12-28 DIAGNOSIS — E7800 Pure hypercholesterolemia, unspecified: Secondary | ICD-10-CM | POA: Diagnosis not present

## 2022-12-28 DIAGNOSIS — Z0000 Encounter for general adult medical examination without abnormal findings: Secondary | ICD-10-CM | POA: Diagnosis not present

## 2022-12-30 ENCOUNTER — Other Ambulatory Visit (HOSPITAL_COMMUNITY): Payer: Self-pay

## 2023-01-12 ENCOUNTER — Other Ambulatory Visit (HOSPITAL_COMMUNITY): Payer: Self-pay

## 2023-01-12 DIAGNOSIS — M7661 Achilles tendinitis, right leg: Secondary | ICD-10-CM | POA: Diagnosis not present

## 2023-01-12 MED ORDER — NITROGLYCERIN 0.2 MG/HR TD PT24
0.2000 mg | MEDICATED_PATCH | Freq: Every day | TRANSDERMAL | 0 refills | Status: DC
  Filled 2023-01-12: qty 30, 30d supply, fill #0
  Filled 2023-02-10: qty 30, 30d supply, fill #1

## 2023-01-12 MED ORDER — PREDNISONE 10 MG PO TABS
ORAL_TABLET | ORAL | 0 refills | Status: AC
  Filled 2023-01-12: qty 14, 6d supply, fill #0

## 2023-02-10 ENCOUNTER — Other Ambulatory Visit (HOSPITAL_COMMUNITY): Payer: Self-pay

## 2023-02-10 MED ORDER — NITROGLYCERIN 0.2 MG/HR TD PT24
MEDICATED_PATCH | TRANSDERMAL | 0 refills | Status: DC
  Filled 2023-02-10: qty 30, 30d supply, fill #0
  Filled 2023-03-21: qty 30, 30d supply, fill #1

## 2023-02-27 DIAGNOSIS — M7661 Achilles tendinitis, right leg: Secondary | ICD-10-CM | POA: Diagnosis not present

## 2023-03-21 ENCOUNTER — Other Ambulatory Visit (HOSPITAL_COMMUNITY): Payer: Self-pay

## 2023-03-22 ENCOUNTER — Other Ambulatory Visit (HOSPITAL_COMMUNITY): Payer: Self-pay

## 2023-03-22 MED ORDER — NITROGLYCERIN 0.2 MG/HR TD PT24
MEDICATED_PATCH | TRANSDERMAL | 0 refills | Status: AC
  Filled 2023-03-22: qty 30, 30d supply, fill #0

## 2023-03-23 ENCOUNTER — Other Ambulatory Visit (HOSPITAL_COMMUNITY): Payer: Self-pay

## 2023-04-20 ENCOUNTER — Encounter (INDEPENDENT_AMBULATORY_CARE_PROVIDER_SITE_OTHER): Payer: 59 | Admitting: Ophthalmology

## 2023-04-20 DIAGNOSIS — H2513 Age-related nuclear cataract, bilateral: Secondary | ICD-10-CM

## 2023-04-20 DIAGNOSIS — H353132 Nonexudative age-related macular degeneration, bilateral, intermediate dry stage: Secondary | ICD-10-CM

## 2023-04-20 DIAGNOSIS — H43813 Vitreous degeneration, bilateral: Secondary | ICD-10-CM | POA: Diagnosis not present

## 2023-05-02 DIAGNOSIS — F4323 Adjustment disorder with mixed anxiety and depressed mood: Secondary | ICD-10-CM | POA: Diagnosis not present

## 2023-05-08 DIAGNOSIS — F4323 Adjustment disorder with mixed anxiety and depressed mood: Secondary | ICD-10-CM | POA: Diagnosis not present

## 2023-05-19 DIAGNOSIS — F4323 Adjustment disorder with mixed anxiety and depressed mood: Secondary | ICD-10-CM | POA: Diagnosis not present

## 2023-06-12 ENCOUNTER — Other Ambulatory Visit: Payer: Self-pay

## 2023-06-12 ENCOUNTER — Other Ambulatory Visit (HOSPITAL_COMMUNITY): Payer: Self-pay

## 2023-06-13 ENCOUNTER — Other Ambulatory Visit (HOSPITAL_COMMUNITY): Payer: Self-pay

## 2023-06-16 ENCOUNTER — Other Ambulatory Visit (HOSPITAL_COMMUNITY): Payer: Self-pay

## 2023-08-02 DIAGNOSIS — L245 Irritant contact dermatitis due to other chemical products: Secondary | ICD-10-CM | POA: Diagnosis not present

## 2023-08-02 DIAGNOSIS — L218 Other seborrheic dermatitis: Secondary | ICD-10-CM | POA: Diagnosis not present

## 2023-08-02 DIAGNOSIS — L821 Other seborrheic keratosis: Secondary | ICD-10-CM | POA: Diagnosis not present

## 2023-08-02 DIAGNOSIS — L814 Other melanin hyperpigmentation: Secondary | ICD-10-CM | POA: Diagnosis not present

## 2023-08-02 DIAGNOSIS — L235 Allergic contact dermatitis due to other chemical products: Secondary | ICD-10-CM | POA: Diagnosis not present

## 2023-08-02 DIAGNOSIS — D1801 Hemangioma of skin and subcutaneous tissue: Secondary | ICD-10-CM | POA: Diagnosis not present

## 2023-08-10 DIAGNOSIS — N819 Female genital prolapse, unspecified: Secondary | ICD-10-CM | POA: Diagnosis not present

## 2023-08-10 DIAGNOSIS — N760 Acute vaginitis: Secondary | ICD-10-CM | POA: Diagnosis not present

## 2023-08-10 DIAGNOSIS — N898 Other specified noninflammatory disorders of vagina: Secondary | ICD-10-CM | POA: Diagnosis not present

## 2023-08-10 DIAGNOSIS — Z113 Encounter for screening for infections with a predominantly sexual mode of transmission: Secondary | ICD-10-CM | POA: Diagnosis not present

## 2023-08-11 ENCOUNTER — Other Ambulatory Visit (HOSPITAL_COMMUNITY): Payer: Self-pay

## 2023-08-11 MED ORDER — METRONIDAZOLE 500 MG PO TABS
500.0000 mg | ORAL_TABLET | Freq: Two times a day (BID) | ORAL | 0 refills | Status: DC
  Filled 2023-08-11: qty 14, 7d supply, fill #0

## 2023-08-11 MED ORDER — TERCONAZOLE 0.4 % VA CREA
1.0000 | TOPICAL_CREAM | Freq: Every day | VAGINAL | 0 refills | Status: DC
  Filled 2023-08-11: qty 45, 7d supply, fill #0

## 2023-09-05 DIAGNOSIS — N760 Acute vaginitis: Secondary | ICD-10-CM | POA: Diagnosis not present

## 2023-09-05 DIAGNOSIS — N950 Postmenopausal bleeding: Secondary | ICD-10-CM | POA: Diagnosis not present

## 2023-09-05 DIAGNOSIS — Z113 Encounter for screening for infections with a predominantly sexual mode of transmission: Secondary | ICD-10-CM | POA: Diagnosis not present

## 2023-09-06 ENCOUNTER — Other Ambulatory Visit (HOSPITAL_COMMUNITY): Payer: Self-pay

## 2023-09-06 MED ORDER — TERCONAZOLE 0.8 % VA CREA
TOPICAL_CREAM | VAGINAL | 0 refills | Status: DC
  Filled 2023-09-06: qty 20, 3d supply, fill #0

## 2023-09-07 ENCOUNTER — Other Ambulatory Visit (HOSPITAL_COMMUNITY): Payer: Self-pay

## 2023-09-08 ENCOUNTER — Other Ambulatory Visit (HOSPITAL_COMMUNITY): Payer: Self-pay

## 2023-09-11 ENCOUNTER — Other Ambulatory Visit (HOSPITAL_COMMUNITY): Payer: Self-pay

## 2023-10-03 DIAGNOSIS — R9389 Abnormal findings on diagnostic imaging of other specified body structures: Secondary | ICD-10-CM | POA: Diagnosis not present

## 2023-10-03 DIAGNOSIS — N950 Postmenopausal bleeding: Secondary | ICD-10-CM | POA: Diagnosis not present

## 2023-10-05 ENCOUNTER — Other Ambulatory Visit (HOSPITAL_COMMUNITY): Payer: Self-pay

## 2023-10-05 MED ORDER — IBUPROFEN 800 MG PO TABS: 800.0000 mg | ORAL_TABLET | Freq: Three times a day (TID) | ORAL | 0 refills | Status: DC | PRN

## 2023-10-05 MED ORDER — MISOPROSTOL 200 MCG PO TABS: 200.0000 ug | ORAL_TABLET | Freq: Once | ORAL | 0 refills | Status: DC

## 2023-10-11 DIAGNOSIS — R9389 Abnormal findings on diagnostic imaging of other specified body structures: Secondary | ICD-10-CM | POA: Diagnosis not present

## 2023-10-11 DIAGNOSIS — N950 Postmenopausal bleeding: Secondary | ICD-10-CM | POA: Diagnosis not present

## 2023-10-11 DIAGNOSIS — N840 Polyp of corpus uteri: Secondary | ICD-10-CM | POA: Diagnosis not present

## 2023-12-26 ENCOUNTER — Other Ambulatory Visit (HOSPITAL_COMMUNITY): Payer: Self-pay

## 2023-12-27 ENCOUNTER — Other Ambulatory Visit (HOSPITAL_COMMUNITY): Payer: Self-pay

## 2023-12-27 MED ORDER — PROGESTERONE MICRONIZED 100 MG PO CAPS
100.0000 mg | ORAL_CAPSULE | Freq: Every day | ORAL | 0 refills | Status: AC
  Filled 2023-12-27 (×2): qty 90, 90d supply, fill #0

## 2023-12-28 ENCOUNTER — Other Ambulatory Visit (HOSPITAL_COMMUNITY): Payer: Self-pay

## 2023-12-28 DIAGNOSIS — N760 Acute vaginitis: Secondary | ICD-10-CM | POA: Diagnosis not present

## 2023-12-28 DIAGNOSIS — N950 Postmenopausal bleeding: Secondary | ICD-10-CM | POA: Diagnosis not present

## 2023-12-28 DIAGNOSIS — Z113 Encounter for screening for infections with a predominantly sexual mode of transmission: Secondary | ICD-10-CM | POA: Diagnosis not present

## 2023-12-28 MED ORDER — PROGESTERONE 200 MG PO CAPS
200.0000 mg | ORAL_CAPSULE | Freq: Every day | ORAL | 3 refills | Status: AC
  Filled 2023-12-28: qty 90, 90d supply, fill #0

## 2023-12-29 ENCOUNTER — Other Ambulatory Visit (HOSPITAL_COMMUNITY): Payer: Self-pay

## 2023-12-29 MED ORDER — FLUCONAZOLE 150 MG PO TABS
150.0000 mg | ORAL_TABLET | ORAL | 0 refills | Status: DC
  Filled 2023-12-29: qty 2, 3d supply, fill #0

## 2024-01-02 ENCOUNTER — Other Ambulatory Visit (HOSPITAL_COMMUNITY): Payer: Self-pay

## 2024-01-04 DIAGNOSIS — E7800 Pure hypercholesterolemia, unspecified: Secondary | ICD-10-CM | POA: Diagnosis not present

## 2024-01-04 DIAGNOSIS — Z0000 Encounter for general adult medical examination without abnormal findings: Secondary | ICD-10-CM | POA: Diagnosis not present

## 2024-01-25 ENCOUNTER — Other Ambulatory Visit: Payer: Self-pay

## 2024-01-25 ENCOUNTER — Other Ambulatory Visit (HOSPITAL_COMMUNITY): Payer: Self-pay

## 2024-01-25 MED ORDER — FLUCONAZOLE 150 MG PO TABS
150.0000 mg | ORAL_TABLET | ORAL | 0 refills | Status: DC
  Filled 2024-01-25: qty 2, 6d supply, fill #0

## 2024-02-13 ENCOUNTER — Other Ambulatory Visit: Payer: Self-pay

## 2024-02-13 ENCOUNTER — Other Ambulatory Visit (HOSPITAL_COMMUNITY): Payer: Self-pay

## 2024-02-13 MED ORDER — MISOPROSTOL 200 MCG PO TABS
ORAL_TABLET | ORAL | 0 refills | Status: DC
  Filled 2024-02-13: qty 1, 1d supply, fill #0

## 2024-02-19 DIAGNOSIS — N840 Polyp of corpus uteri: Secondary | ICD-10-CM | POA: Diagnosis not present

## 2024-02-19 DIAGNOSIS — N950 Postmenopausal bleeding: Secondary | ICD-10-CM | POA: Diagnosis not present

## 2024-03-29 ENCOUNTER — Other Ambulatory Visit (HOSPITAL_COMMUNITY): Payer: Self-pay

## 2024-03-29 DIAGNOSIS — Z6823 Body mass index (BMI) 23.0-23.9, adult: Secondary | ICD-10-CM | POA: Diagnosis not present

## 2024-03-29 DIAGNOSIS — Z01419 Encounter for gynecological examination (general) (routine) without abnormal findings: Secondary | ICD-10-CM | POA: Diagnosis not present

## 2024-03-29 DIAGNOSIS — Z1231 Encounter for screening mammogram for malignant neoplasm of breast: Secondary | ICD-10-CM | POA: Diagnosis not present

## 2024-03-29 DIAGNOSIS — Z124 Encounter for screening for malignant neoplasm of cervix: Secondary | ICD-10-CM | POA: Diagnosis not present

## 2024-03-29 MED ORDER — PROGESTERONE 200 MG PO CAPS
200.0000 mg | ORAL_CAPSULE | Freq: Every day | ORAL | 3 refills | Status: AC
  Filled 2024-03-29: qty 90, 90d supply, fill #0
  Filled 2024-06-17: qty 90, 90d supply, fill #1
  Filled 2024-08-28 – 2024-09-18 (×2): qty 90, 90d supply, fill #2

## 2024-04-02 ENCOUNTER — Other Ambulatory Visit (HOSPITAL_COMMUNITY): Payer: Self-pay

## 2024-04-17 ENCOUNTER — Encounter (INDEPENDENT_AMBULATORY_CARE_PROVIDER_SITE_OTHER): Payer: 59 | Admitting: Ophthalmology

## 2024-04-17 DIAGNOSIS — H353132 Nonexudative age-related macular degeneration, bilateral, intermediate dry stage: Secondary | ICD-10-CM

## 2024-04-17 DIAGNOSIS — H43813 Vitreous degeneration, bilateral: Secondary | ICD-10-CM

## 2024-05-30 DIAGNOSIS — F4323 Adjustment disorder with mixed anxiety and depressed mood: Secondary | ICD-10-CM | POA: Diagnosis not present

## 2024-06-04 DIAGNOSIS — F4323 Adjustment disorder with mixed anxiety and depressed mood: Secondary | ICD-10-CM | POA: Diagnosis not present

## 2024-06-17 ENCOUNTER — Other Ambulatory Visit (HOSPITAL_COMMUNITY): Payer: Self-pay

## 2024-06-20 ENCOUNTER — Other Ambulatory Visit (HOSPITAL_COMMUNITY): Payer: Self-pay

## 2024-06-25 ENCOUNTER — Other Ambulatory Visit: Payer: Self-pay

## 2024-06-25 ENCOUNTER — Other Ambulatory Visit (HOSPITAL_COMMUNITY): Payer: Self-pay

## 2024-06-25 DIAGNOSIS — F4323 Adjustment disorder with mixed anxiety and depressed mood: Secondary | ICD-10-CM | POA: Diagnosis not present

## 2024-06-25 MED ORDER — ESTRADIOL 0.05 MG/24HR TD PTTW
1.0000 | MEDICATED_PATCH | TRANSDERMAL | 3 refills | Status: AC
  Filled 2024-06-25: qty 24, 84d supply, fill #0
  Filled 2024-08-28 – 2024-09-01 (×2): qty 24, 84d supply, fill #1

## 2024-06-26 ENCOUNTER — Other Ambulatory Visit (HOSPITAL_COMMUNITY): Payer: Self-pay

## 2024-06-27 ENCOUNTER — Other Ambulatory Visit (HOSPITAL_COMMUNITY): Payer: Self-pay

## 2024-07-02 DIAGNOSIS — F4323 Adjustment disorder with mixed anxiety and depressed mood: Secondary | ICD-10-CM | POA: Diagnosis not present

## 2024-07-11 DIAGNOSIS — F4323 Adjustment disorder with mixed anxiety and depressed mood: Secondary | ICD-10-CM | POA: Diagnosis not present

## 2024-07-16 DIAGNOSIS — F4323 Adjustment disorder with mixed anxiety and depressed mood: Secondary | ICD-10-CM | POA: Diagnosis not present

## 2024-07-16 DIAGNOSIS — R102 Pelvic and perineal pain unspecified side: Secondary | ICD-10-CM | POA: Diagnosis not present

## 2024-08-01 DIAGNOSIS — F4323 Adjustment disorder with mixed anxiety and depressed mood: Secondary | ICD-10-CM | POA: Diagnosis not present

## 2024-08-05 DIAGNOSIS — F4323 Adjustment disorder with mixed anxiety and depressed mood: Secondary | ICD-10-CM | POA: Diagnosis not present

## 2024-08-15 DIAGNOSIS — F4323 Adjustment disorder with mixed anxiety and depressed mood: Secondary | ICD-10-CM | POA: Diagnosis not present

## 2024-08-19 DIAGNOSIS — F4323 Adjustment disorder with mixed anxiety and depressed mood: Secondary | ICD-10-CM | POA: Diagnosis not present

## 2024-08-28 ENCOUNTER — Other Ambulatory Visit (HOSPITAL_COMMUNITY): Payer: Self-pay

## 2024-09-02 ENCOUNTER — Other Ambulatory Visit: Payer: Self-pay

## 2024-09-09 ENCOUNTER — Encounter: Payer: Self-pay | Admitting: *Deleted

## 2024-09-12 ENCOUNTER — Other Ambulatory Visit (HOSPITAL_COMMUNITY): Payer: Self-pay

## 2024-09-13 ENCOUNTER — Other Ambulatory Visit (HOSPITAL_COMMUNITY): Payer: Self-pay

## 2024-09-20 ENCOUNTER — Other Ambulatory Visit (HOSPITAL_COMMUNITY): Payer: Self-pay

## 2025-04-18 ENCOUNTER — Encounter (INDEPENDENT_AMBULATORY_CARE_PROVIDER_SITE_OTHER): Admitting: Ophthalmology
